# Patient Record
Sex: Male | Born: 1960 | Race: Black or African American | Hispanic: No | Marital: Married | Smoking: Former smoker
Health system: Southern US, Community
[De-identification: ages and names within clinical notes are randomized; demographics above are authoritative.]

## PROBLEM LIST (undated history)

## (undated) HISTORY — PX: SHOULDER SURGERY: SHX246

---

## 2012-05-18 ENCOUNTER — Ambulatory Visit: Payer: Self-pay | Admitting: Family Medicine

## 2012-05-18 VITALS — BP 121/80 | HR 58 | Temp 97.9°F | Resp 16 | Ht 73.4 in | Wt 222.8 lb

## 2012-05-18 DIAGNOSIS — S39012A Strain of muscle, fascia and tendon of lower back, initial encounter: Secondary | ICD-10-CM

## 2012-05-18 DIAGNOSIS — M549 Dorsalgia, unspecified: Secondary | ICD-10-CM

## 2012-05-18 DIAGNOSIS — IMO0002 Reserved for concepts with insufficient information to code with codable children: Secondary | ICD-10-CM

## 2012-05-18 MED ORDER — TRAMADOL HCL 50 MG PO TABS: 50.0000 mg | ORAL_TABLET | Freq: Three times a day (TID) | ORAL | Status: DC | PRN

## 2012-05-18 MED ORDER — CYCLOBENZAPRINE HCL 10 MG PO TABS: 10.0000 mg | ORAL_TABLET | Freq: Two times a day (BID) | ORAL | Status: DC | PRN

## 2012-05-18 NOTE — Progress Notes (Signed)
[facility] [address], [place] [address] [phone]  Date:  05/18/2012   Name:  Patrick Christian   DOB:  1960/09/20   MRN:  [id number]  PCP:  No primary provider on file.    Chief Complaint: Back Pain   History of Present Illness:  Patrick Christian is a 51 y.o. very pleasant male patient who presents with the following:  Today is Wednesday.  He lifted his grandson on Sunday and twisted to put him into the bathtub- he is 51 year old. He had immediate pain in his right lower back.  He is normally very active- he works pouring concrete- and does not have back pains.   Since his injury his pain has persisted.  He went to work on Monday- by the next morning he was worse.   He tried some aleve- this helped some.   He has pain while walking.  Better when supine.  He does not note radiation of the pain down his leg.  No numbness or weakness, no bowel or bladder control problems.    He has had a similar back problem in the past.  He was seen at the [facility] in [facility] and was treated with medications and rest- and he did get better.    There is no problem list on file for this patient.   No past medical history on file.  No past surgical history on file.  History  Substance Use Topics  . Smoking status: Current Every Day Smoker    Types: Cigarettes  . Smokeless tobacco: Not on file   Comment: 2-3 cigarettes a day  . Alcohol Use: Not on file    No family history on file.  Allergies  Allergen Reactions  . Penicillins     Makes joint sore     Medication list has been reviewed and updated.  No current outpatient prescriptions on file prior to visit.    Review of Systems:  As per HPI- otherwise negative. He does have a history of gout in his foot  Physical Examination: Filed Vitals:   05/18/12 0837  BP: 121/80  Pulse: 58  Temp: 97.9 F (36.6 C)  Resp: 16   Filed Vitals:   05/18/12 0837  Height: 6' 1.4" (1.864 m)  Weight: 222 lb 12.8 oz  (101.061 kg)   Body mass index is 29.08 kg/(m^2). Ideal Body Weight: Weight in (lb) to have BMI = 25: 191.2   GEN: WDWN, NAD, Non-toxic, A & O x 3, trim build HEENT: Atraumatic, Normocephalic. Neck supple. No masses, No LAD.  EOMI Ears and Nose: No external deformity. CV: RRR, No M/G/R. No JVD. No thrill. No extra heart sounds. PULM: CTA B, no wheezes, crackles, rhonchi. No retractions. No resp. distress. No accessory muscle use. EXTR: No c/c/e NEURO Normal gait.   Normal LE strength and sensation, normal DTR all extremities.  Negative SLR bilaterally Back: he has tenderness and muscular spasm in his right lumbar muscles.  No bony tenderness.  Flexion and extension ok.   PSYCH: Normally interactive. Conversant. Not depressed or anxious appearing.  Calm demeanor.    Assessment and Plan: 1. Back strain    2. Back pain  traMADol (ULTRAM) 50 MG tablet, cyclobenzaprine (FLEXERIL) 10 MG tablet   Back strain- treat as above. Note to be OOW until Monday, recommended heat and gentle activity/ stretching.  Patient (or parent if minor) instructed to return to clinic or call if not better in 2-3 day(s).  Lamar Blinks, MD

## 2012-05-18 NOTE — Patient Instructions (Addendum)
Please let me know if your back is not better within a few days- Sooner if worse.   Please think about getting a flu shot- this is important to protect not just yourself, but also those who are weaker and not as able to withstand the flu (children and elderly persons)    Take care and good to see you today!

## 2012-08-04 ENCOUNTER — Emergency Department (INDEPENDENT_AMBULATORY_CARE_PROVIDER_SITE_OTHER)
Admission: EM | Admit: 2012-08-04 | Discharge: 2012-08-04 | Disposition: A | Discharge status: Home / Self Care | Payer: Self-pay | Source: Home / Self Care | Attending: Family Medicine | Admitting: Family Medicine

## 2012-08-04 ENCOUNTER — Encounter (HOSPITAL_COMMUNITY): Payer: Self-pay | Admitting: Emergency Medicine

## 2012-08-04 DIAGNOSIS — M549 Dorsalgia, unspecified: Secondary | ICD-10-CM

## 2012-08-04 DIAGNOSIS — M545 Low back pain: Secondary | ICD-10-CM

## 2012-08-04 DIAGNOSIS — K219 Gastro-esophageal reflux disease without esophagitis: Secondary | ICD-10-CM

## 2012-08-04 DIAGNOSIS — K5900 Constipation, unspecified: Secondary | ICD-10-CM

## 2012-08-04 LAB — POCT URINALYSIS DIP (DEVICE)
Bilirubin Urine: NEGATIVE
Glucose, UA: NEGATIVE mg/dL
Hgb urine dipstick: NEGATIVE
Ketones, ur: NEGATIVE mg/dL
Specific Gravity, Urine: >=1.03 (ref 1.005–1.030)

## 2012-08-04 LAB — COMPREHENSIVE METABOLIC PANEL
ALT: 37 U/L (ref 0–53)
AST: 28 U/L (ref 0–37)
CO2: 23 mEq/L (ref 19–32)
Calcium: 9.8 mg/dL (ref 8.4–10.5)
Chloride: 104 mEq/L (ref 96–112)
Creatinine, Ser: 1.07 mg/dL (ref 0.50–1.35)
GFR calc Af Amer: >90 mL/min (ref >90)
GFR calc non Af Amer: 79 mL/min — ABNORMAL LOW (ref >90)
Glucose, Bld: 91 mg/dL (ref 70–99)
Total Bilirubin: 0.4 mg/dL (ref 0.3–1.2)

## 2012-08-04 MED ORDER — POLYETHYLENE GLYCOL 3350 17 G PO PACK: 17.0000 g | PACK | Freq: Every day | ORAL | Status: DC

## 2012-08-04 MED ORDER — TRAMADOL HCL 50 MG PO TABS: 50.0000 mg | ORAL_TABLET | Freq: Three times a day (TID) | ORAL | Status: DC | PRN

## 2012-08-04 MED ORDER — CYCLOBENZAPRINE HCL 10 MG PO TABS: 10.0000 mg | ORAL_TABLET | Freq: Two times a day (BID) | ORAL | Status: DC | PRN

## 2012-08-04 MED ORDER — OMEPRAZOLE 20 MG PO CPDR: DELAYED_RELEASE_CAPSULE | ORAL | Status: DC

## 2012-08-04 NOTE — ED Provider Notes (Signed)
History     [name]: [id number]  Arrival date & time 08/04/12  4098   First MD Initiated Contact with Patient 08/04/12 [phone]      Chief Complaint  Patient presents with  . Abdominal Pain    stomach ache/acid reflux denies n/v/d or constipaition  . Back Pain    back hurts with deep breathing or sneezing denies injury    (Consider location/radiation/quality/duration/timing/severity/associated sxs/prior treatment) HPI Comments: 51 year old male with prior history of chronic low back pain, alcohol and cigarette abuse currently on a rehabilitation program. Here complaining of low back pain worse with sneezing, taking a deep breath, movement or prolonged standing; patient reports chronic back pain with exacerbation for the last 2 days. Denies dysuria or hematuria. no fever or chills. No recent falls or direct injury. Patient also complaining of acid reflux with acid taste in his mouth. Generalized abdominal discomfort for about 3 days associated with constipation. He has taken a over-the-counter laxative and had soft stools 2 days ago his stools "were hard again last evening". No diarrhea. Has had episodes of intermittent nausea but no vomiting.   History reviewed. No pertinent past medical history.  Past Surgical History  Procedure Date  . Shoulder surgery     History reviewed. No pertinent family history.  History  Substance Use Topics  . Smoking status: Former Smoker    Types: Cigarettes  . Smokeless tobacco: Not on file     Comment: 2-3 cigarettes a day  . Alcohol Use: No      Review of Systems  Constitutional: Negative for fever, chills, activity change, appetite change, fatigue and unexpected weight change.  Respiratory: Negative for cough and shortness of breath.   Cardiovascular: Negative for chest pain, palpitations and leg swelling.  Gastrointestinal: Positive for nausea, abdominal pain and constipation. Negative for vomiting and blood in stool.  Genitourinary: Negative  for dysuria, frequency, hematuria and flank pain.  Musculoskeletal: Positive for back pain.  Skin: Negative for rash.  Neurological: Negative for dizziness and headaches.  All other systems reviewed and are negative.    Allergies  Penicillins  Home Medications   Current Outpatient Rx  Name  Route  Sig  Dispense  Refill  . CYCLOBENZAPRINE HCL 10 MG PO TABS   Oral   Take 1 tablet (10 mg total) by mouth 2 (two) times daily as needed for muscle spasms.   20 tablet   0   . OMEPRAZOLE 20 MG PO CPDR      1 tab by mouth twice a day for 1 week then 1 tab daily as needed   30 capsule   0   . POLYETHYLENE GLYCOL 3350 PO PACK   Oral   Take 17 g by mouth daily.   14 each   0   . TRAMADOL HCL 50 MG PO TABS   Oral   Take 1 tablet (50 mg total) by mouth every 8 (eight) hours as needed for pain.   20 tablet   0     BP 127/95  Pulse 67  Temp 98 F (36.7 C) (Oral)  Resp 24  SpO2 98%  Physical Exam  Nursing note and vitals reviewed. Constitutional: He is oriented to person, place, and time. He appears well-developed and well-nourished. No distress.  HENT:  Head: Normocephalic and atraumatic.  Mouth/Throat: Oropharynx is clear and moist. No oropharyngeal exudate.  Eyes: Conjunctivae normal are normal. No scleral icterus.  Neck: Neck supple. No thyromegaly present.  Cardiovascular: Normal rate, regular rhythm  and normal heart sounds.   Pulmonary/Chest: Effort normal and breath sounds normal. No respiratory distress. He has no wheezes. He has no rales. He exhibits no tenderness.  Abdominal: Soft. Bowel sounds are normal. He exhibits no distension and no mass. There is no tenderness. There is no rebound and no guarding.  Musculoskeletal:       Central spine with no scoliosis or kyphosis. Fair anterior flexion and posterior extension. Patient able to walk on tip toes and heels with no difficulty foot drop or pain exacerbation. No bone prominence tenderness. Tenderness to palpation  in bilateral lumbar paravertebral muscles.  Negative straight leg test bilateral. Intact sensation and symmetric + DTRs (rotullian and achillean) in low extremities.    Lymphadenopathy:    He has no cervical adenopathy.  Neurological: He is alert and oriented to person, place, and time.  Skin: No rash noted. He is not diaphoretic.    ED Course  Procedures (including critical care time)  Labs Reviewed  POCT URINALYSIS DIP (DEVICE) - Abnormal; Notable for the following:    Protein, ur 100 (*)     All other components within normal limits  COMPREHENSIVE METABOLIC PANEL  LIPASE, BLOOD   No results found.   1. Constipation   2. GERD (gastroesophageal reflux disease)   3. Low back pain without sciatica   4. Back pain       MDM  Impress gastritis, chronic low back pain and constipation. Treated with omeprazole, refilled tramadol and Flexeril. Asked to avoid NSAID's. Recommended MiraLax, back exercises as per handout. Supportive care and red flags that should prompt his return to medical attention discussed with patient and provided in writing. Provided primary care clinics list to find a PCP for follow up to monitor his symptoms. Complete metabolic panel and lipase test pending at the time of discharge . Lab results reviewed after discharge, no further changes in treatment.   Sharin Grave, MD 08/05/12 [phone]

## 2012-08-04 NOTE — ED Notes (Signed)
Pt c/o back pain with deep breathing and sneezing denies injury. Pt is also c/o stomach pain and having acid reflux pt stated that he to a laxative to try and relive symptoms, with no relief. Pt denies n/v/d and constipation. Symptoms x 3 days.

## 2012-09-12 ENCOUNTER — Emergency Department (HOSPITAL_COMMUNITY)
Admission: EM | Admit: 2012-09-12 | Discharge: 2012-09-12 | Disposition: A | Discharge status: Home / Self Care | Payer: Self-pay | Attending: Emergency Medicine | Admitting: Emergency Medicine

## 2012-09-12 ENCOUNTER — Encounter (HOSPITAL_COMMUNITY): Payer: Self-pay | Admitting: *Deleted

## 2012-09-12 DIAGNOSIS — R21 Rash and other nonspecific skin eruption: Secondary | ICD-10-CM | POA: Insufficient documentation

## 2012-09-12 DIAGNOSIS — L299 Pruritus, unspecified: Secondary | ICD-10-CM | POA: Insufficient documentation

## 2012-09-12 DIAGNOSIS — Z87891 Personal history of nicotine dependence: Secondary | ICD-10-CM | POA: Insufficient documentation

## 2012-09-12 MED ORDER — TRIAMCINOLONE ACETONIDE 0.1 % EX CREA: TOPICAL_CREAM | Freq: Two times a day (BID) | CUTANEOUS | Status: DC

## 2012-09-12 MED ORDER — PERMETHRIN 5 % EX CREA: TOPICAL_CREAM | CUTANEOUS | Status: DC

## 2012-09-12 NOTE — ED Provider Notes (Signed)
Medical screening examination/treatment/procedure(s) were performed by non-physician practitioner and as supervising physician I was immediately available for consultation/collaboration.   Roiza Wiedel B. Traniece Boffa, MD 09/12/12 1531 

## 2012-09-12 NOTE — ED Notes (Signed)
Itching rash to upper back, neck and behind hears. Denies pain. Onset Saturday.

## 2012-09-12 NOTE — ED Provider Notes (Signed)
History     [name]: [id number]  Arrival date & time 09/12/12  7829   First MD Initiated Contact with Patient 09/12/12 0913      Chief Complaint  Patient presents with  . Rash    (Consider location/radiation/quality/duration/timing/severity/associated sxs/prior treatment) HPI Nikolaos Kats is a 52 y.o. male who presents with complaint of a rash. States rash began 2 days ago. To the right upper back. States very itchy. Denies fever, chills, malaise. States rash is mildly tender, mostly itchy, states unable to sleep due to intense itching. Applied camphor to it, no relief. Denies any other complaints. Pt is staying in a [facility], states there is a bed bug situation there. No hx of the same.    History reviewed. No pertinent past medical history.  Past Surgical History  Procedure Date  . Shoulder surgery     No family history on file.  History  Substance Use Topics  . Smoking status: Former Smoker    Types: Cigarettes  . Smokeless tobacco: Not on file     Comment: 2-3 cigarettes a day  . Alcohol Use: No      Review of Systems  Constitutional: Negative for fever and chills.  Respiratory: Negative.   Cardiovascular: Negative.   Skin: Positive for rash.    Allergies  Penicillins  Home Medications  No current outpatient prescriptions on file.  BP 133/94  Pulse 81  Temp 97.6 F (36.4 C) (Oral)  Resp 18  SpO2 97%  Physical Exam  Nursing note and vitals reviewed. Constitutional: He appears well-developed and well-nourished. No distress.  Eyes: Conjunctivae normal are normal.  Neck: Neck supple.  Cardiovascular: Normal rate, regular rhythm and normal heart sounds.   Pulmonary/Chest: Effort normal and breath sounds normal. No respiratory distress. He has no wheezes. He has no rales.  Skin:       Erythematous rash to the right upper back that is raised, macular, with some induration. There are central papules in linear patters. Non tender. Excoriations noted. No  drainage, no scaling, no blisters.      ED Course  Procedures (including critical care time)  Filed Vitals:   09/12/12 0819  BP: 133/94  Pulse: 81  Temp: 97.6 F (36.4 C)  Resp: 18     1. Rash       MDM  Pt with a non tender, itchy rash to the right upper back. No vescicles or blisters. Doubt  Shingles. Rash does include papules in a linear patters. Pt comes from group home. Possible contact dermatitis, states has been spraying his room with anti bed bug medications. Also question scabies, although no rash in any other locations. Pt afebrile, non toxic, doubt cellulitis. Will try kenalog cream and permethrin, with close follow up for recheck.         Lottie Mussel, [place] 09/12/12 [phone]

## 2012-09-12 NOTE — ED Notes (Signed)
Pt is here with rash to upper back that is itching

## 2012-10-23 ENCOUNTER — Encounter (HOSPITAL_COMMUNITY): Payer: Self-pay | Admitting: Emergency Medicine

## 2012-10-23 ENCOUNTER — Emergency Department (INDEPENDENT_AMBULATORY_CARE_PROVIDER_SITE_OTHER): Payer: Self-pay

## 2012-10-23 ENCOUNTER — Emergency Department (INDEPENDENT_AMBULATORY_CARE_PROVIDER_SITE_OTHER)
Admission: EM | Admit: 2012-10-23 | Discharge: 2012-10-23 | Disposition: A | Discharge status: Home / Self Care | Payer: Self-pay | Source: Home / Self Care | Attending: Family Medicine | Admitting: Family Medicine

## 2012-10-23 DIAGNOSIS — S6010XA Contusion of unspecified finger with damage to nail, initial encounter: Secondary | ICD-10-CM

## 2012-10-23 DIAGNOSIS — S6000XA Contusion of unspecified finger without damage to nail, initial encounter: Secondary | ICD-10-CM

## 2012-10-23 DIAGNOSIS — S62639A Displaced fracture of distal phalanx of unspecified finger, initial encounter for closed fracture: Secondary | ICD-10-CM

## 2012-10-23 NOTE — ED Notes (Signed)
Waiting discharge papers 

## 2012-10-23 NOTE — ED Notes (Signed)
Pt c/o injury to middle finger of the left hand. Pt states that two hours ago his finger got caught between a drain grate. Nail is discolored mild swelling.

## 2012-10-23 NOTE — ED Provider Notes (Signed)
History     [name]: [id number]  Arrival date & time 10/23/12  1231   First MD Initiated Contact with Patient 10/23/12 1246      Chief Complaint  Patient presents with  . Finger Injury    left hand middle finger    (Consider location/radiation/quality/duration/timing/severity/associated sxs/prior treatment) Patient is a 52 y.o. male presenting with hand pain. The history is provided by the patient.  Hand Pain This is a new problem. The current episode started 1 to 2 hours ago (dropped manhole cover on lmf tip while working. today.). The problem has been gradually worsening.    History reviewed. No pertinent past medical history.  Past Surgical History  Procedure Laterality Date  . Shoulder surgery      History reviewed. No pertinent family history.  History  Substance Use Topics  . Smoking status: Former Smoker    Types: Cigarettes  . Smokeless tobacco: Not on file     Comment: 2-3 cigarettes a day  . Alcohol Use: No      Review of Systems  Constitutional: Negative.   Musculoskeletal: Positive for joint swelling.  Skin: Positive for wound.    Allergies  Penicillins  Home Medications   Current Outpatient Rx  Name  Route  Sig  Dispense  Refill  . permethrin (ELIMITE) 5 % cream      Apply to affected area once   60 g   0   . triamcinolone cream (KENALOG) 0.1 %   Topical   Apply topically 2 (two) times daily.   30 g   0     BP 127/83  Pulse 80  Temp(Src) 97.8 F (36.6 C) (Oral)  Resp 18  SpO2 97%  Physical Exam  Nursing note and vitals reviewed. Constitutional: He is oriented to person, place, and time. He appears well-developed and well-nourished.  Musculoskeletal: He exhibits tenderness.       Hands: Neurological: He is alert and oriented to person, place, and time.  Skin: Skin is warm and dry.    ED Course  ORTHOPEDIC INJURY TREATMENT Date/Time: 10/23/2012 1:43 PM Performed by: Linna Hoff Authorized by: Bradd Canary D Consent:  Verbal consent obtained. Consent given by: patient Injury location: finger Location details: left long finger Injury type: soft tissue Pre-procedure neurovascular assessment: neurovascularly intact Pre-procedure distal perfusion: normal Pre-procedure range of motion: normal Local anesthesia used: no Patient sedated: no Supplies used: aluminum splint (hyfrecator drainage of subungual hematoma.) Post-procedure neurovascular assessment: post-procedure neurovascularly intact Post-procedure range of motion: normal Patient tolerance: Patient tolerated the procedure well with no immediate complications.   (including critical care time)  Labs Reviewed - No data to display Dg Finger Middle Left  10/23/2012  *RADIOLOGY REPORT*  Clinical Data: Crush injury.  Pain.  LEFT MIDDLE FINGER 2+V  Comparison: None.  Findings: There is a fracture of the distal tuft of the long finger without angulation or displacement.  No sign of radiopaque foreign object.  IMPRESSION: Fracture of the distal tuft.   Original Report Authenticated By: Paulina Fusi, M.D.      1. Closed fracture of tuft of distal phalanx of finger, closed, initial encounter   2. Subungual hematoma of finger of left hand, initial encounter       MDM  X-rays reviewed and report per radiologist.         Linna Hoff, MD 10/23/12 [phone]

## 2012-10-28 ENCOUNTER — Emergency Department (INDEPENDENT_AMBULATORY_CARE_PROVIDER_SITE_OTHER)
Admission: EM | Admit: 2012-10-28 | Discharge: 2012-10-28 | Disposition: A | Discharge status: Home / Self Care | Payer: Self-pay | Source: Home / Self Care | Attending: Family Medicine | Admitting: Family Medicine

## 2012-10-28 ENCOUNTER — Encounter (HOSPITAL_COMMUNITY): Payer: Self-pay | Admitting: Emergency Medicine

## 2012-10-28 DIAGNOSIS — S6000XA Contusion of unspecified finger without damage to nail, initial encounter: Secondary | ICD-10-CM

## 2012-10-28 DIAGNOSIS — S6000XD Contusion of unspecified finger without damage to nail, subsequent encounter: Secondary | ICD-10-CM

## 2012-10-28 MED ORDER — CEPHALEXIN 500 MG PO CAPS: 500.0000 mg | ORAL_CAPSULE | Freq: Four times a day (QID) | ORAL | Status: DC

## 2012-10-28 NOTE — ED Provider Notes (Signed)
History     [name]: [id number]  Arrival date & time 10/28/12  5284   First MD Initiated Contact with Patient 10/28/12 1915      Chief Complaint  Patient presents with  . Follow-up    infected middle finger of right hand    (Consider location/radiation/quality/duration/timing/severity/associated sxs/prior treatment) Patient is a 52 y.o. male presenting with hand pain. The history is provided by the patient.  Hand Pain This is a new problem. Episode onset: seen 2/23 with lmf injury, tuft fx., concern about possible infection raised today b/o fluid from nail. The problem has not changed since onset.Treatments tried: has been wearing splint and drainage noted today.    History reviewed. No pertinent past medical history.  Past Surgical History  Procedure Laterality Date  . Shoulder surgery      History reviewed. No pertinent family history.  History  Substance Use Topics  . Smoking status: Former Smoker    Types: Cigarettes  . Smokeless tobacco: Not on file     Comment: 2-3 cigarettes a day  . Alcohol Use: No      Review of Systems  Constitutional: Negative.   Musculoskeletal: Negative.   Skin: Positive for wound.    Allergies  Penicillins  Home Medications   Current Outpatient Rx  Name  Route  Sig  Dispense  Refill  . cephALEXin (KEFLEX) 500 MG capsule   Oral   Take 1 capsule (500 mg total) by mouth 4 (four) times daily. Take all of medicine and drink lots of fluids   20 capsule   0   . permethrin (ELIMITE) 5 % cream      Apply to affected area once   60 g   0   . triamcinolone cream (KENALOG) 0.1 %   Topical   Apply topically 2 (two) times daily.   30 g   0     BP 130/86  Pulse 77  Temp(Src) 98.6 F (37 C) (Oral)  Resp 16  SpO2 97%  Physical Exam  Nursing note and vitals reviewed. Constitutional: He appears well-developed and well-nourished. No distress.  Musculoskeletal: He exhibits no tenderness.       Hands: Skin: Skin is warm and  dry. No erythema.    ED Course  Procedures (including critical care time)  Labs Reviewed - No data to display No results found.   1. Fingertip contusion, subsequent encounter       MDM  No erythema , min drainage, min soreness.        Linna Hoff, MD 10/29/12 [phone]

## 2012-10-28 NOTE — ED Notes (Signed)
Pt in for follow of infected right middle finger. Pt states "still having some mild soreness and minimal drainage" Pt voices no other concerns.

## 2013-08-21 ENCOUNTER — Encounter (HOSPITAL_COMMUNITY): Payer: Self-pay | Admitting: Emergency Medicine

## 2013-08-21 ENCOUNTER — Emergency Department (HOSPITAL_COMMUNITY)
Admission: EM | Admit: 2013-08-21 | Discharge: 2013-08-22 | Disposition: A | Discharge status: Home / Self Care | Payer: Self-pay | Attending: Emergency Medicine | Admitting: Emergency Medicine

## 2013-08-21 ENCOUNTER — Emergency Department (HOSPITAL_COMMUNITY): Payer: Self-pay

## 2013-08-21 DIAGNOSIS — Z860100 Personal history of colon polyps, unspecified: Secondary | ICD-10-CM | POA: Insufficient documentation

## 2013-08-21 DIAGNOSIS — M25469 Effusion, unspecified knee: Secondary | ICD-10-CM | POA: Insufficient documentation

## 2013-08-21 DIAGNOSIS — Z79899 Other long term (current) drug therapy: Secondary | ICD-10-CM | POA: Insufficient documentation

## 2013-08-21 DIAGNOSIS — R197 Diarrhea, unspecified: Secondary | ICD-10-CM | POA: Insufficient documentation

## 2013-08-21 DIAGNOSIS — M25569 Pain in unspecified knee: Secondary | ICD-10-CM | POA: Insufficient documentation

## 2013-08-21 DIAGNOSIS — Z87891 Personal history of nicotine dependence: Secondary | ICD-10-CM | POA: Insufficient documentation

## 2013-08-21 DIAGNOSIS — Z880 Allergy status to penicillin: Secondary | ICD-10-CM | POA: Insufficient documentation

## 2013-08-21 DIAGNOSIS — K922 Gastrointestinal hemorrhage, unspecified: Secondary | ICD-10-CM | POA: Insufficient documentation

## 2013-08-21 MED ORDER — IOHEXOL 300 MG/ML  SOLN
50.0000 mL | Freq: Once | INTRAMUSCULAR | Status: AC | PRN
  Administered 2013-08-21: 50 mL via ORAL

## 2013-08-21 NOTE — ED Notes (Signed)
Pt states that about 2am he started having diarrhea  Pt sates he has been about 8 times since then  Pt states the last time he had a bm it had blood in it  Pt is c/o lower abd pain

## 2013-08-21 NOTE — ED Notes (Signed)
Pt complains of dark red blood in his stool since 2am

## 2013-08-21 NOTE — ED Provider Notes (Addendum)
[name]: [id number]     Arrival date & time 08/21/13  2251 History   First MD Initiated Contact with Patient 08/21/13 2302     Chief Complaint  Patient presents with  . Rectal Bleeding   (Consider location/radiation/quality/duration/timing/severity/associated sxs/prior Treatment) HPI 52 year old male presents to emergency room with complaint of abdominal pain, diarrhea, and onset of dark red stools, just prior to arrival.  Patient reports he was awoken with abdominal discomfort this morning at 2 AM.  He has had 8-10 loose stools throughout the day.  About an hour prior to arrival, he had a bowel movement that was mostly blood.  No prior history of same.  Patient reports he's been taking ibuprofen over the last week, 400 mg total a day due to some knee pain and swelling.  Patient reports colonoscopy done about 3 years ago, told he had some polyps.  No prior history of diverticulitis or diverticulosis.  He denies any previous abdominal surgery.  No fevers no chills.  No nausea no vomiting.  History reviewed. No pertinent past medical history. Past Surgical History  Procedure Laterality Date  . Shoulder surgery     History reviewed. No pertinent family history. History  Substance Use Topics  . Smoking status: Former Smoker    Types: Cigarettes  . Smokeless tobacco: Not on file     Comment: 2-3 cigarettes a day  . Alcohol Use: No    Review of Systems  See History of Present Illness; otherwise all other systems are reviewed and negative Allergies  Penicillins  Home Medications   Current Outpatient Rx  Name  Route  Sig  Dispense  Refill  . ibuprofen (ADVIL,MOTRIN) 200 MG tablet   Oral   Take 200 mg by mouth every 6 (six) hours as needed (pain).         . Inulin (METAMUCIL CLEAR & NATURAL PO)   Oral   Take 1 scoop by mouth daily.          BP 142/93  Pulse 86  Temp(Src) 97.8 F (36.6 C) (Oral)  Resp 18  SpO2 95% Physical Exam  Nursing note and vitals  reviewed. Constitutional: He is oriented to person, place, and time. He appears well-developed and well-nourished.  HENT:  Head: Normocephalic and atraumatic.  Nose: Nose normal.  Mouth/Throat: Oropharynx is clear and moist.  Eyes: Conjunctivae and EOM are normal. Pupils are equal, round, and reactive to light.  Neck: Normal range of motion. Neck supple. No JVD present. No tracheal deviation present. No thyromegaly present.  Cardiovascular: Normal rate, regular rhythm, normal heart sounds and intact distal pulses.  Exam reveals no gallop and no friction rub.   No murmur heard. Pulmonary/Chest: Effort normal and breath sounds normal. No stridor. No respiratory distress. He has no wheezes. He has no rales. He exhibits no tenderness.  Abdominal: Soft. Bowel sounds are normal. He exhibits no distension and no mass. There is tenderness (mild tenderness in left lower quadrant). There is no rebound and no guarding.  Musculoskeletal: Normal range of motion. He exhibits no edema and no tenderness.  Lymphadenopathy:    He has no cervical adenopathy.  Neurological: He is alert and oriented to person, place, and time. He exhibits normal muscle tone. Coordination normal.  Skin: Skin is warm and dry. No rash noted. No erythema. No pallor.  Psychiatric: He has a normal mood and affect. His behavior is normal. Judgment and thought content normal.    ED Course  Procedures (including critical care time) Labs  Review Labs Reviewed  CBC WITH DIFFERENTIAL - Abnormal; Notable for the following:    Neutrophils Relative % 36 (*)    Lymphocytes Relative 50 (*)    All other components within normal limits  COMPREHENSIVE METABOLIC PANEL - Abnormal; Notable for the following:    Glucose, Bld 139 (*)    Total Bilirubin 0.2 (*)    GFR calc non Af Amer 67 (*)    GFR calc Af Amer 77 (*)    All other components within normal limits  LIPASE, BLOOD  SAMPLE TO BLOOD BANK   Imaging Review Ct Abdomen Pelvis W  Contrast  08/22/2013   CLINICAL DATA:  Left lower quadrant pain, dark stools.  EXAM: CT ABDOMEN AND PELVIS WITH CONTRAST  TECHNIQUE: Multidetector CT imaging of the abdomen and pelvis was performed using the standard protocol following bolus administration of intravenous contrast.  CONTRAST:  OMNIPAQUE IOHEXOL 300 MG/ML  SOLN  COMPARISON:  None available  FINDINGS: Mild subsegmental atelectasis is seen dependently within the visualized lung bases. The lung bases are otherwise unremarkable. Transverse heart size is within normal limits. No pericardial effusion.  Diffuse hypoattenuation seen within the liver is suggestive of steatosis. Geographic sharply demarcated hyperdensity seen within the posterior/superior right hepatic lobe likely represents vascular/AV shunting (series 2, image 9- 16). No focal intrahepatic lesions identified.  Gallbladder is within normal limits. No biliary ductal dilatation. The spleen is unremarkable. The adrenal glands and pancreas demonstrate a normal contrast enhanced appearance.  Kidneys are equal in size with symmetric enhancement. No nephrolithiasis, hydronephrosis, or focal enhancing renal mass is identified.  The stomach is partially distended but grossly normal. Small bowel is within normal limits without evidence of inflammatory changes. The appendix is well visualized in the right lower quadrant and is of normal caliber and appearance without associated inflammatory changes to suggest acute appendicitis. The colon is within normal limits without evidence of abnormal wall thickening, mucosal enhancement, or inflammatory fat stranding. No intraluminal fluid fluid levels identified. The distal sigmoid colon is largely decompressed but grossly normal.  Bladder is unremarkable.  Prostate is within normal limits.  No pathologically enlarged intra-abdominal or pelvic lymph nodes are identified.  A few scattered atherosclerotic calcifications noted within the a intra-abdominal  aorta and bilateral iliac vessels.  No acute osseous abnormality identified. No focal lytic or blastic osseous lesion is seen  IMPRESSION: *No CT evidence of acute intra-abdominal or pelvic process. Specifically, no inflammatory changes seen within the colon or rectosigmoid region. *Normal appendix. *Hepatic steatosis with probable vascular shunting within the right hepatic lobe as above.   Electronically Signed   By: Rise Mu M.D.   On: 08/22/2013 01:28    EKG Interpretation   None       MDM   1. Diarrhea   2. Lower GI bleeding    53 year old male with bloody stools, with one day of lower abdominal pain.  Diverticulitis is high, and differential.  Plan for labs, CT scan.   Olivia Mackie, MD 08/22/13 0205  2:33 AM Pt has had another episode of liquid stool mixed with blood.  No further abdominal pain, no dizziness, shortness of breath, or other symptoms.  Advised to f/u with GI.  Given strict precautions for return.  Olivia Mackie, MD 08/22/13 [phone]

## 2013-08-22 ENCOUNTER — Emergency Department (HOSPITAL_COMMUNITY): Payer: Self-pay

## 2013-08-22 ENCOUNTER — Encounter (HOSPITAL_COMMUNITY): Payer: Self-pay

## 2013-08-22 LAB — CBC WITH DIFFERENTIAL/PLATELET
Basophils Absolute: 0 10*3/uL (ref 0.0–0.1)
Eosinophils Absolute: 0.3 10*3/uL (ref 0.0–0.7)
Eosinophils Relative: 4 % (ref 0–5)
MCH: 28.8 pg (ref 26.0–34.0)
MCV: 86 fL (ref 78.0–100.0)
Neutrophils Relative %: 36 % — ABNORMAL LOW (ref 43–77)
Platelets: 197 10*3/uL (ref 150–400)
RBC: 4.8 MIL/uL (ref 4.22–5.81)
RDW: 14 % (ref 11.5–15.5)
WBC: 6.1 10*3/uL (ref 4.0–10.5)

## 2013-08-22 LAB — COMPREHENSIVE METABOLIC PANEL
ALT: 30 U/L (ref 0–53)
AST: 21 U/L (ref 0–37)
Albumin: 3.7 g/dL (ref 3.5–5.2)
Alkaline Phosphatase: 112 U/L (ref 39–117)
Calcium: 9.4 mg/dL (ref 8.4–10.5)
Potassium: 3.5 mEq/L (ref 3.5–5.1)
Sodium: 138 mEq/L (ref 135–145)
Total Protein: 6.7 g/dL (ref 6.0–8.3)

## 2013-08-22 LAB — SAMPLE TO BLOOD BANK

## 2013-08-22 MED ORDER — IOHEXOL 300 MG/ML  SOLN
100.0000 mL | Freq: Once | INTRAMUSCULAR | Status: AC | PRN
  Administered 2013-08-22: 100 mL via INTRAVENOUS

## 2013-08-22 MED ORDER — DICYCLOMINE HCL 20 MG PO TABS: 20.0000 mg | ORAL_TABLET | Freq: Four times a day (QID) | ORAL | Status: DC | PRN

## 2013-08-22 NOTE — ED Notes (Signed)
Returned from CT scan.

## 2013-10-05 ENCOUNTER — Emergency Department (INDEPENDENT_AMBULATORY_CARE_PROVIDER_SITE_OTHER): Payer: Self-pay

## 2013-10-05 ENCOUNTER — Emergency Department (INDEPENDENT_AMBULATORY_CARE_PROVIDER_SITE_OTHER)
Admission: EM | Admit: 2013-10-05 | Discharge: 2013-10-05 | Disposition: A | Discharge status: Home / Self Care | Payer: Self-pay | Source: Home / Self Care | Attending: Emergency Medicine | Admitting: Emergency Medicine

## 2013-10-05 ENCOUNTER — Encounter (HOSPITAL_COMMUNITY): Payer: Self-pay | Admitting: Emergency Medicine

## 2013-10-05 DIAGNOSIS — M1610 Unilateral primary osteoarthritis, unspecified hip: Secondary | ICD-10-CM

## 2013-10-05 MED ORDER — METHYLPREDNISOLONE ACETATE 40 MG/ML IJ SUSP
80.0000 mg | Freq: Once | INTRAMUSCULAR | Status: AC
  Administered 2013-10-05: 80 mg via INTRAMUSCULAR

## 2013-10-05 MED ORDER — KETOROLAC TROMETHAMINE 60 MG/2ML IM SOLN
60.0000 mg | Freq: Once | INTRAMUSCULAR | Status: AC
  Administered 2013-10-05: 60 mg via INTRAMUSCULAR

## 2013-10-05 MED ORDER — NAPROXEN 500 MG PO TABS: 500.0000 mg | ORAL_TABLET | Freq: Two times a day (BID) | ORAL | Status: AC

## 2013-10-05 MED ORDER — METHYLPREDNISOLONE ACETATE 80 MG/ML IJ SUSP
INTRAMUSCULAR | Status: AC
Start: 2013-10-05 — End: 2013-10-05
  Filled 2013-10-05: qty 1

## 2013-10-05 MED ORDER — KETOROLAC TROMETHAMINE 60 MG/2ML IM SOLN
INTRAMUSCULAR | Status: AC
  Filled 2013-10-05: qty 2

## 2013-10-05 NOTE — ED Provider Notes (Signed)
Medical screening examination/treatment/procedure(s) were performed by non-physician practitioner and as supervising physician I was immediately available for consultation/collaboration.  Leslee Homeavid Iliana Hutt, M.D.   Reuben Likesavid C Venezia Sargeant, MD 10/05/13 [phone]

## 2013-10-05 NOTE — Discharge Instructions (Signed)

## 2013-10-05 NOTE — ED Provider Notes (Signed)
[name]: [id number]     Arrival date & time 10/05/13  1151 History   First MD Initiated Contact with Patient 10/05/13 1352     No chief complaint on file.  (Consider location/radiation/quality/duration/timing/severity/associated sxs/prior Treatment) HPI Comments: 53 year old male presents complaining of right hip pain. One-week, he has had progressively worsening pain in the right hip is markedly more severe with attempted weightbearing. He has no history of hip problems and has had no injury. The pain does not radiate. He feels the pain in the anterior hip, just medial to the hip on the front. He denies swelling in the groin. Denies any constipation or pain moving his bowels. He does have a recent history of some knee pain that required wearing a brace 2 weeks ago, this is mostly resolved.   History reviewed. No pertinent past medical history. Past Surgical History  Procedure Laterality Date  . Shoulder surgery     History reviewed. No pertinent family history. History  Substance Use Topics  . Smoking status: Former Smoker    Types: Cigarettes  . Smokeless tobacco: Not on file     Comment: 2-3 cigarettes a day  . Alcohol Use: No    Review of Systems  Constitutional: Negative for fever, chills and fatigue.  HENT: Negative for sore throat.   Eyes: Negative for visual disturbance.  Respiratory: Negative for cough and shortness of breath.   Cardiovascular: Negative for chest pain, palpitations and leg swelling.  Gastrointestinal: Negative for nausea, vomiting, abdominal pain, diarrhea and constipation.  Genitourinary: Negative for dysuria, urgency, frequency and hematuria.  Musculoskeletal:       Hip pain  Skin: Negative for rash.  Neurological: Negative for dizziness, weakness and light-headedness.    Allergies  Penicillins  Home Medications   Current Outpatient Rx  Name  Route  Sig  Dispense  Refill  . dicyclomine (BENTYL) 20 MG tablet   Oral   Take 1 tablet (20 mg total) by  mouth every 6 (six) hours as needed for spasms (for abdominal cramping).   20 tablet   0   . ibuprofen (ADVIL,MOTRIN) 200 MG tablet   Oral   Take 200 mg by mouth every 6 (six) hours as needed (pain).         . Inulin (METAMUCIL CLEAR & NATURAL PO)   Oral   Take 1 scoop by mouth daily.         . naproxen (NAPROSYN) 500 MG tablet   Oral   Take 1 tablet (500 mg total) by mouth 2 (two) times daily.   60 tablet   1    BP 134/92  Pulse 83  Temp(Src) 98.1 F (36.7 C) (Oral)  Resp 16  SpO2 96% Physical Exam  Nursing note and vitals reviewed. Constitutional: He is oriented to person, place, and time. He appears well-developed and well-nourished. No distress.  HENT:  Head: Normocephalic.  Pulmonary/Chest: Effort normal. No respiratory distress.  Musculoskeletal:       Left hip: He exhibits decreased range of motion (decreased internal rotation). He exhibits normal strength, no tenderness, no bony tenderness, no swelling, no crepitus and no deformity.  Neurological: He is alert and oriented to person, place, and time. Coordination normal.  Skin: Skin is warm and dry. No rash noted. He is not diaphoretic.  Psychiatric: He has a normal mood and affect. Judgment normal.    ED Course  Procedures (including critical care time) Labs Review Labs Reviewed - No data to display Imaging Review Dg Hip Complete  Right  10/05/2013   CLINICAL DATA:  One-week history of atraumatic right hip pain  EXAM: RIGHT HIP - COMPLETE 2+ VIEW  COMPARISON:  None.  FINDINGS: The bony pelvis appears adequately mineralized. There is very mild narrowing of the right hip joint space as compared to the left. AP and lateral views of the hip reveal no additional narrowing. The bony structures appear normal. There is no lytic or blastic lesion. The overlying soft tissues are normal in appearance.  IMPRESSION: There is no acute bony abnormality of the right hip. Minimal joint space narrowing may be present.    Electronically Signed   By: David  SwazilandJordan   On: 10/05/2013 14:19      MDM   1. Arthritis, hip    Given toradol and depomedrol here, discharged on BID naprosyn.  F/u with ortho if worsening or if not improving    Meds ordered this encounter  Medications  . methylPREDNISolone acetate (DEPO-MEDROL) injection 80 mg    Sig:   . ketorolac (TORADOL) injection 60 mg    Sig:   . naproxen (NAPROSYN) 500 MG tablet    Sig: Take 1 tablet (500 mg total) by mouth 2 (two) times daily.    Dispense:  60 tablet    Refill:  1       Graylon GoodZachary H Ericka Marcellus, PA-C 10/05/13 1439

## 2013-12-19 ENCOUNTER — Encounter (HOSPITAL_COMMUNITY): Payer: Self-pay | Admitting: Emergency Medicine

## 2013-12-19 ENCOUNTER — Emergency Department (HOSPITAL_COMMUNITY)
Admission: EM | Admit: 2013-12-19 | Discharge: 2013-12-19 | Disposition: A | Discharge status: Home / Self Care | Payer: No Typology Code available for payment source | Attending: Emergency Medicine | Admitting: Emergency Medicine

## 2013-12-19 ENCOUNTER — Emergency Department (HOSPITAL_COMMUNITY): Payer: No Typology Code available for payment source

## 2013-12-19 DIAGNOSIS — Y92410 Unspecified street and highway as the place of occurrence of the external cause: Secondary | ICD-10-CM | POA: Insufficient documentation

## 2013-12-19 DIAGNOSIS — T148XXA Other injury of unspecified body region, initial encounter: Secondary | ICD-10-CM

## 2013-12-19 DIAGNOSIS — IMO0002 Reserved for concepts with insufficient information to code with codable children: Secondary | ICD-10-CM | POA: Insufficient documentation

## 2013-12-19 DIAGNOSIS — S0993XA Unspecified injury of face, initial encounter: Secondary | ICD-10-CM | POA: Insufficient documentation

## 2013-12-19 DIAGNOSIS — S40019A Contusion of unspecified shoulder, initial encounter: Secondary | ICD-10-CM | POA: Insufficient documentation

## 2013-12-19 DIAGNOSIS — Z880 Allergy status to penicillin: Secondary | ICD-10-CM | POA: Insufficient documentation

## 2013-12-19 DIAGNOSIS — S199XXA Unspecified injury of neck, initial encounter: Secondary | ICD-10-CM

## 2013-12-19 DIAGNOSIS — Y9389 Activity, other specified: Secondary | ICD-10-CM | POA: Insufficient documentation

## 2013-12-19 DIAGNOSIS — Z791 Long term (current) use of non-steroidal anti-inflammatories (NSAID): Secondary | ICD-10-CM | POA: Insufficient documentation

## 2013-12-19 DIAGNOSIS — Z79899 Other long term (current) drug therapy: Secondary | ICD-10-CM | POA: Insufficient documentation

## 2013-12-19 DIAGNOSIS — Z9889 Other specified postprocedural states: Secondary | ICD-10-CM | POA: Insufficient documentation

## 2013-12-19 DIAGNOSIS — Z87891 Personal history of nicotine dependence: Secondary | ICD-10-CM | POA: Insufficient documentation

## 2013-12-19 MED ORDER — IBUPROFEN 200 MG PO TABS
600.0000 mg | ORAL_TABLET | Freq: Once | ORAL | Status: AC
  Administered 2013-12-19: 600 mg via ORAL
  Filled 2013-12-19: qty 3

## 2013-12-19 MED ORDER — CYCLOBENZAPRINE HCL 5 MG PO TABS: 5.0000 mg | ORAL_TABLET | Freq: Three times a day (TID) | ORAL | Status: AC | PRN

## 2013-12-19 MED ORDER — CYCLOBENZAPRINE HCL 5 MG PO TABS: 5.0000 mg | ORAL_TABLET | Freq: Three times a day (TID) | ORAL | Status: DC | PRN

## 2013-12-19 MED ORDER — IBUPROFEN 600 MG PO TABS: 600.0000 mg | ORAL_TABLET | Freq: Once | ORAL | Status: DC

## 2013-12-19 MED ORDER — IBUPROFEN 600 MG PO TABS: 600.0000 mg | ORAL_TABLET | Freq: Four times a day (QID) | ORAL | Status: AC | PRN

## 2013-12-19 NOTE — Discharge Instructions (Signed)
Contusion A contusion is a deep bruise. Contusions are the result of an injury that caused bleeding under the skin. The contusion may turn blue, purple, or yellow. Minor injuries will give you a painless contusion, but more severe contusions may stay painful and swollen for a few weeks.  CAUSES  A contusion is usually caused by a blow, trauma, or direct force to an area of the body. SYMPTOMS   Swelling and redness of the injured area.  Bruising of the injured area.  Tenderness and soreness of the injured area.  Pain. DIAGNOSIS  The diagnosis can be made by taking a history and physical exam. An X-ray, CT scan, or MRI may be needed to determine if there were any associated injuries, such as fractures. TREATMENT  Specific treatment will depend on what area of the body was injured. In general, the best treatment for a contusion is resting, icing, elevating, and applying cold compresses to the injured area. Over-the-counter medicines may also be recommended for pain control. Ask your caregiver what the best treatment is for your contusion. HOME CARE INSTRUCTIONS   Put ice on the injured area.  Put ice in a plastic bag.  Place a towel between your skin and the bag.  Leave the ice on for 15-20 minutes, 03-04 times a day.  Only take over-the-counter or prescription medicines for pain, discomfort, or fever as directed by your caregiver. Your caregiver may recommend avoiding anti-inflammatory medicines (aspirin, ibuprofen, and naproxen) for 48 hours because these medicines may increase bruising.  Rest the injured area.  If possible, elevate the injured area to reduce swelling. SEEK IMMEDIATE MEDICAL CARE IF:   You have increased bruising or swelling.  You have pain that is getting worse.  Your swelling or pain is not relieved with medicines. MAKE SURE YOU:   Understand these instructions.  Will watch your condition.  Will get help right away if you are not doing well or get  worse. Document Released: 05/27/2005 Document Revised: 11/09/2011 Document Reviewed: 06/22/2011 [facility] Patient Information 2014 [place], [place]. Your x-ray is normal.  Been given a prescription for a muscle relaxer as well, as an anti-inflammatory.  Take these on a regular basis for the next 3-5 days.  Followup with your primary care physician as needed
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
## 2013-12-20 NOTE — ED Provider Notes (Signed)
Medical screening examination/treatment/procedure(s) were performed by non-physician practitioner and as supervising physician I was immediately available for consultation/collaboration.   EKG Interpretation None        Lyanne CoKevin M Jini Horiuchi, MD 12/20/13 [phone]

## 2014-01-10 ENCOUNTER — Encounter (HOSPITAL_COMMUNITY): Payer: Self-pay | Admitting: Emergency Medicine

## 2014-01-10 ENCOUNTER — Emergency Department (HOSPITAL_COMMUNITY)
Admission: EM | Admit: 2014-01-10 | Discharge: 2014-01-11 | Disposition: A | Discharge status: Home / Self Care | Payer: No Typology Code available for payment source | Attending: Emergency Medicine | Admitting: Emergency Medicine

## 2014-01-10 DIAGNOSIS — Z79899 Other long term (current) drug therapy: Secondary | ICD-10-CM | POA: Insufficient documentation

## 2014-01-10 DIAGNOSIS — Z880 Allergy status to penicillin: Secondary | ICD-10-CM | POA: Insufficient documentation

## 2014-01-10 DIAGNOSIS — R3911 Hesitancy of micturition: Secondary | ICD-10-CM

## 2014-01-10 DIAGNOSIS — M545 Low back pain, unspecified: Secondary | ICD-10-CM | POA: Insufficient documentation

## 2014-01-10 DIAGNOSIS — IMO0002 Reserved for concepts with insufficient information to code with codable children: Secondary | ICD-10-CM | POA: Insufficient documentation

## 2014-01-10 DIAGNOSIS — Z791 Long term (current) use of non-steroidal anti-inflammatories (NSAID): Secondary | ICD-10-CM | POA: Insufficient documentation

## 2014-01-10 DIAGNOSIS — Z87891 Personal history of nicotine dependence: Secondary | ICD-10-CM | POA: Insufficient documentation

## 2014-01-10 DIAGNOSIS — R300 Dysuria: Secondary | ICD-10-CM | POA: Insufficient documentation

## 2014-01-10 DIAGNOSIS — G8911 Acute pain due to trauma: Secondary | ICD-10-CM | POA: Insufficient documentation

## 2014-01-10 LAB — URINE MICROSCOPIC-ADD ON

## 2014-01-10 LAB — URINALYSIS, ROUTINE W REFLEX MICROSCOPIC
BILIRUBIN URINE: NEGATIVE
GLUCOSE, UA: NEGATIVE mg/dL
HGB URINE DIPSTICK: NEGATIVE
Ketones, ur: NEGATIVE mg/dL
Leukocytes, UA: NEGATIVE
Nitrite: NEGATIVE
PROTEIN: 30 mg/dL — AB
Specific Gravity, Urine: 1.023 (ref 1.005–1.030)
UROBILINOGEN UA: 1 mg/dL (ref 0.0–1.0)
pH: 6 (ref 5.0–8.0)
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
## 2014-01-10 NOTE — ED Notes (Signed)
Pt states past 10 days has had urinary frequency and problems urinating, states when he does urinate he has back pain.

## 2014-01-11 ENCOUNTER — Emergency Department (HOSPITAL_COMMUNITY): Payer: No Typology Code available for payment source

## 2014-01-11 MED ORDER — TAMSULOSIN HCL 0.4 MG PO CAPS: 0.4000 mg | ORAL_CAPSULE | Freq: Every day | ORAL | Status: AC

## 2014-01-11 NOTE — ED Notes (Signed)
Post void residual . PA aware.
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
